# Patient Record
Sex: Male | Born: 2005 | Race: White | Hispanic: Yes | Marital: Single | Smoking: Never smoker
Health system: Southern US, Community
[De-identification: ages and names within clinical notes are randomized; demographics above are authoritative.]

## PROBLEM LIST (undated history)

## (undated) DIAGNOSIS — U071 COVID-19: Secondary | ICD-10-CM

---
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
## 2024-01-22 DIAGNOSIS — S4381XD Sprain of other specified parts of right shoulder girdle, subsequent encounter: Secondary | ICD-10-CM | POA: Diagnosis not present

## 2024-02-26 DIAGNOSIS — S4381XD Sprain of other specified parts of right shoulder girdle, subsequent encounter: Secondary | ICD-10-CM | POA: Diagnosis not present

## 2024-03-01 ENCOUNTER — Encounter (HOSPITAL_BASED_OUTPATIENT_CLINIC_OR_DEPARTMENT_OTHER): Payer: Self-pay | Admitting: Orthopaedic Surgery

## 2024-03-01 ENCOUNTER — Other Ambulatory Visit: Payer: Self-pay

## 2024-03-01 DIAGNOSIS — S4381XD Sprain of other specified parts of right shoulder girdle, subsequent encounter: Secondary | ICD-10-CM | POA: Diagnosis not present

## 2024-03-01 NOTE — Progress Notes (Signed)
   03/01/24 9077  PAT Phone Screen  Is the patient taking a GLP-1 receptor agonist? No  Do You Have Diabetes? No  Do You Have Hypertension? No  Have You Ever Been to the ER for Asthma? No  Have You Taken Oral Steroids in the Past 3 Months? No  Do you Take Phenteramine or any Other Diet Drugs? No  Recent  Lab Work, EKG, CXR? Yes  Where was this test performed? 11/04/23 [facility]  Do you have a history of heart problems? No  Any Recent Hospitalizations? No  Height 5' 10 (1.778 m)  Weight 95.3 kg  Pat Appointment Scheduled No  Reason for No Appointment Not Needed   ED visit for syncope, labs, EKG and imaging reviewed w/ Dr ErmaGLENWOOD Belton to proceed as planned w/ upcoming surgery on 6/26.

## 2024-03-01 NOTE — H&P (Signed)
 PREOPERATIVE H&P  Chief Complaint: RIGHT SHOULDER CARTILAGE DISORDER, HARDWARE COMPLICATIONS, INFECTION  HPI: Brian Lyons is a 18 y.o. male who is scheduled for, Procedure(s): ARTHROSCOPY, SHOULDER WITH DEBRIDEMENT.   Patient had a CC ligament repair on December 09, 2023. He has had issues with his wound off and on. He initially responded to antibiotics. However about 2 weeks ago his started to notice his incision was more red and swollen.   Symptoms are rated as moderate to severe, and have been worsening.  This is significantly impairing activities of daily living.    Please see clinic note for further details on this patient's care.    He has elected for surgical management.   Past Medical History:  Diagnosis Date   Acne 04/07/2020   BMI (body mass index), pediatric, greater than or equal to 95% for age 88/14/2014   COVID-19    cough and nasal drainage   Past Surgical History:  Procedure Laterality Date   ORIF FIBULA FRACTURE Left 09/24/2020   Procedure: OPEN REDUCTION INTERNAL FIXATION (ORIF) LEFT DISTAL FIBULA FRACTURE;  Surgeon: Cristy Bonner DASEN, MD;  Location: [facility] OR;  Service: Orthopedics;  Laterality: Left;   Social History   Socioeconomic History   Marital status: Single    Spouse name: Not on file   Number of children: Not on file   Years of education: Not on file   Highest education level: Not on file  Occupational History   Not on file  Tobacco Use   Smoking status: Never   Smokeless tobacco: Never  Vaping Use   Vaping status: Never Used  Substance and Sexual Activity   Alcohol use: Never   Drug use: Never   Sexual activity: Not on file  Other Topics Concern   Not on file  Social History Narrative   Not on file   Social Drivers of Health   Financial Resource Strain: Not on file  Food Insecurity: Not on file  Transportation Needs: Not on file  Physical Activity: Not on file  Stress: Not on file  Social Connections: Not on file    Family History  Problem Relation Age of Onset   Anxiety disorder Brother        following parents' deportation   ADD / ADHD Brother    Asthma Sister        mild, persistent   Diabetes Mother    Obesity Mother    Heart Problems Mother        Myxoma 2016   Hyperlipidemia Father    Alcohol abuse Father    Diabetes Maternal Grandmother    Hypertension Maternal Grandmother    Diabetes Maternal Grandfather    Hypertension Paternal Grandmother    Cancer Paternal Grandfather    No Known Allergies Prior to Admission medications   Not on File    ROS: All other systems have been reviewed and were otherwise negative with the exception of those mentioned in the HPI and as above.  Physical Exam: General: Alert, no acute distress Cardiovascular: No pedal edema Respiratory: No cyanosis, no use of accessory musculature GI: No organomegaly, abdomen is soft and non-tender Skin: No lesions in the area of chief complaint Neurologic: Sensation intact distally Psychiatric: Patient is competent for consent with normal mood and affect Lymphatic: No axillary or cervical lymphadenopathy  MUSCULOSKELETAL:  Right shoulder: incision with keloid scar present. Fluctuance. No active drainage.   Imaging: N/A  Assessment: RIGHT SHOULDER CARTILAGE DISORDER, HARDWARE COMPLICATIONS, INFECTION  Plan: Plan  for Procedure(s): ARTHROSCOPY, SHOULDER WITH DEBRIDEMENT  The risks benefits and alternatives were discussed with the patient including but not limited to the risks of nonoperative treatment, versus surgical intervention including infection, bleeding, nerve injury,  blood clots, cardiopulmonary complications, morbidity, mortality, among others, and they were willing to proceed.   The patient acknowledged the explanation, agreed to proceed with the plan and consent was signed.   Operative Plan: right shoulder scope with irrigation and debridement Discharge Medications: standard + antibiotics DVT  Prophylaxis: none Physical Therapy: continue PT Special Discharge needs: +/-   Aleck LOISE Stalling, PA-C  03/01/2024 3:30 PM

## 2024-03-01 NOTE — Discharge Instructions (Signed)
 Bonner Hair MD, MPH Aleck Stalling, PA-C [facility] [address], [address] [phone] (tel)   [phone] (fax)   POST-OPERATIVE INSTRUCTIONS - SHOULDER ARTHROSCOPY  WOUND CARE You may remove the Operative Dressing on Post-Op Day #3 (72hrs after surgery).   BUT KEEP YOUR WOUND VAC IN PLACE Alternatively if you would like you can leave dressing on until follow-up if within 7-8 days but keep it dry. Leave steri-strips in place until they fall off on their own, usually 2 weeks postop. There may be a small amount of fluid/bleeding leaking at the surgical site.  This is normal; the shoulder is filled with fluid during the procedure and can leak for 24-48hrs after surgery.  You may change/reinforce the bandage as needed.  Use the Cryocuff or Ice as often as possible for the first 7 days, then as needed for pain relief. Always keep a towel, ACE wrap or other barrier between the cooling unit and your skin.  DO NOT GET YOUR WOUND VAC WET YOU MAY BATHE BUT DO NOT GET YOUR SHOULDER WET Do not soak the shoulder in water or submerge it.  Keep incisions as dry as possible. Do not go swimming in the pool or ocean until 4 weeks after surgery or when otherwise instructed.    EXERCISES You may remove your sling once your nerve block wears off After that you do not need one  It is normal for your fingers/hand to become more swollen after surgery and discolored from bruising.   This will resolve over the first few weeks usually after surgery. Please continue to ambulate and do not stay sitting or lying for too long.  Perform foot and wrist pumps to assist in circulation.  REGIONAL ANESTHESIA (NERVE BLOCKS) The anesthesia team may have performed a nerve block for you this is a great tool used to minimize pain.   The block may start wearing off overnight (between 8-24 hours postop) When the block wears off, your pain may go from nearly zero to the pain you would have had  postop without the block. This is an abrupt transition but nothing dangerous is happening.   This can be a challenging period but utilize your as needed pain medications to try and manage this period. We suggest you use the pain medication the first night prior to going to bed, to ease this transition.  You may take an extra dose of narcotic when this happens if needed  POST-OP MEDICATIONS- Multimodal approach to pain control In general your pain will be controlled with a combination of substances.  Prescriptions unless otherwise discussed are electronically sent to your pharmacy.  This is a carefully made plan we use to minimize narcotic use.     Meloxicam - Anti-inflammatory medication taken on a scheduled basis Acetaminophen  - Non-narcotic pain medicine taken on a scheduled basis  Tramadol - This is a strong narcotic, to be used only on an "as needed" basis for SEVERE pain. Zofran  - take as needed for nausea Doxycycline and Bactrim - these are antibiotics, take as instructed  FOLLOW-UP If you develop a Fever (>=101.5), Redness or Drainage from the surgical incision site, please call our office to arrange for an evaluation. YOU WILL FOLLOW UP ON TUESDAY AT 10 AM WITH CAROLINE MCBANE, PA-C    HELPFUL INFORMATION   You may be more comfortable sleeping in a semi-seated position the first few nights following surgery.  Keep a pillow propped under the elbow and forearm for comfort.  If you have a recliner type of chair it might be beneficial.  If not that is fine too, but it would be helpful to sleep propped up with pillows behind your operated shoulder as well under your elbow and forearm.  This will reduce pulling on the suture lines.  When dressing, put your operative arm in the sleeve first.  When getting undressed, take your operative arm out last.  Loose fitting, button-down shirts are recommended.  Often in the first days after surgery you may be more comfortable keeping your operative  arm under your shirt and not through the sleeve.  You may return to work/school in the next couple of days when you feel up to it.  Desk work and typing in the sling is fine.  We suggest you use the pain medication the first night prior to going to bed, in order to ease any pain when the anesthesia wears off. You should avoid taking pain medications on an empty stomach as it will make you nauseous.  You should wean off your narcotic medicines as soon as you are able.  Most patients will be off narcotics before their first postop appointment.   Do not drink alcoholic beverages or take illicit drugs when taking pain medications.  It is against the law to drive while taking narcotics.  In some states it is against the law to drive while your arm is in a sling.   Pain medication may make you constipated.  Below are a few solutions to try in this order: Decrease the amount of pain medication if you aren't having pain. Drink lots of decaffeinated fluids. Drink prune juice and/or eat dried prunes  If the first 3 don't work start with additional solutions Take Colace - an over-the-counter stool softener Take Senokot - an over-the-counter laxative Take Miralax - a stronger over-the-counter laxative  For more information including helpful videos and documents visit our website:   [email or web address]     Post Anesthesia Home Care Instructions  Activity: Get plenty of rest for the remainder of the day. A responsible individual must stay with you for 24 hours following the procedure.  For the next 24 hours, DO NOT: -Drive a car -Advertising copywriter -Drink alcoholic beverages -Take any medication unless instructed by your physician -Make any legal decisions or sign important papers.  Meals: Start with liquid foods such as gelatin or soup. Progress to regular foods as tolerated. Avoid greasy, spicy, heavy foods. If nausea and/or vomiting occur, drink only  clear liquids until the nausea and/or vomiting subsides. Call your physician if vomiting continues.  Special Instructions/Symptoms: Your throat may feel dry or sore from the anesthesia or the breathing tube placed in your throat during surgery. If this causes discomfort, gargle with warm salt water. The discomfort should disappear within 24 hours.  If you had a scopolamine patch placed behind your ear for the management of post- operative nausea and/or vomiting:  1. The medication in the patch is effective for 72 hours, after which it should be removed.  Wrap patch in a tissue and discard in the trash. Wash hands thoroughly with soap and water. 2. You may remove the patch earlier than 72 hours if you experience unpleasant side effects which may include dry mouth, dizziness or visual disturbances. 3. Avoid touching the patch. Wash your hands with soap and water after contact with the patch.

## 2024-03-03 ENCOUNTER — Other Ambulatory Visit: Payer: Self-pay

## 2024-03-03 ENCOUNTER — Encounter (HOSPITAL_BASED_OUTPATIENT_CLINIC_OR_DEPARTMENT_OTHER)
Admission: RE | Disposition: A | Discharge status: Home / Self Care | Payer: Self-pay | Source: Home / Self Care | Attending: Orthopaedic Surgery

## 2024-03-03 ENCOUNTER — Ambulatory Visit (HOSPITAL_BASED_OUTPATIENT_CLINIC_OR_DEPARTMENT_OTHER): Admitting: Certified Registered Nurse Anesthetist

## 2024-03-03 ENCOUNTER — Ambulatory Visit (HOSPITAL_BASED_OUTPATIENT_CLINIC_OR_DEPARTMENT_OTHER)
Admission: RE | Admit: 2024-03-03 | Discharge: 2024-03-03 | Disposition: A | Discharge status: Home / Self Care | Attending: Orthopaedic Surgery | Admitting: Orthopaedic Surgery

## 2024-03-03 ENCOUNTER — Encounter (HOSPITAL_BASED_OUTPATIENT_CLINIC_OR_DEPARTMENT_OTHER): Payer: Self-pay | Admitting: Orthopaedic Surgery

## 2024-03-03 DIAGNOSIS — Y793 Surgical instruments, materials and orthopedic devices (including sutures) associated with adverse incidents: Secondary | ICD-10-CM | POA: Insufficient documentation

## 2024-03-03 DIAGNOSIS — M24111 Other articular cartilage disorders, right shoulder: Secondary | ICD-10-CM | POA: Diagnosis not present

## 2024-03-03 DIAGNOSIS — T84610A Infection and inflammatory reaction due to internal fixation device of right humerus, initial encounter: Secondary | ICD-10-CM | POA: Diagnosis not present

## 2024-03-03 DIAGNOSIS — T8579XA Infection and inflammatory reaction due to other internal prosthetic devices, implants and grafts, initial encounter: Secondary | ICD-10-CM | POA: Insufficient documentation

## 2024-03-03 DIAGNOSIS — T8484XA Pain due to internal orthopedic prosthetic devices, implants and grafts, initial encounter: Secondary | ICD-10-CM | POA: Diagnosis not present

## 2024-03-03 DIAGNOSIS — S40251A Superficial foreign body of right shoulder, initial encounter: Secondary | ICD-10-CM | POA: Diagnosis not present

## 2024-03-03 HISTORY — PX: POSTERIOR LUMBAR FUSION 2 WITH HARDWARE REMOVAL: SHX7297

## 2024-03-03 LAB — AEROBIC/ANAEROBIC CULTURE W GRAM STAIN (SURGICAL/DEEP WOUND)

## 2024-03-03 SURGERY — ARTHROSCOPY, SHOULDER WITH DEBRIDEMENT
Anesthesia: General | Laterality: Right

## 2024-03-03 MED ORDER — MIDAZOLAM HCL 5 MG/5ML IJ SOLN
INTRAMUSCULAR | Status: DC | PRN
Start: 2024-03-03 — End: 2024-03-03
  Administered 2024-03-03: 2 mg via INTRAVENOUS

## 2024-03-03 MED ORDER — MIDAZOLAM HCL 2 MG/2ML IJ SOLN
INTRAMUSCULAR | Status: AC
Start: 2024-03-03 — End: 2024-03-03
  Filled 2024-03-03: qty 2

## 2024-03-03 MED ORDER — LACTATED RINGERS IV SOLN: INTRAVENOUS | Status: DC

## 2024-03-03 MED ORDER — ONDANSETRON HCL 4 MG/2ML IJ SOLN
INTRAMUSCULAR | Status: AC
  Filled 2024-03-03: qty 2

## 2024-03-03 MED ORDER — BUPIVACAINE HCL (PF) 0.25 % IJ SOLN
INTRAMUSCULAR | Status: DC | PRN
  Administered 2024-03-03: 20 mL

## 2024-03-03 MED ORDER — FENTANYL CITRATE (PF) 100 MCG/2ML IJ SOLN: 25.0000 ug | INTRAMUSCULAR | Status: DC | PRN

## 2024-03-03 MED ORDER — TRAMADOL HCL 50 MG PO TABS: 50.0000 mg | ORAL_TABLET | Freq: Four times a day (QID) | ORAL | 0 refills | Status: AC | PRN

## 2024-03-03 MED ORDER — VANCOMYCIN HCL 1000 MG IV SOLR
INTRAVENOUS | Status: AC
  Filled 2024-03-03: qty 20

## 2024-03-03 MED ORDER — OXYCODONE HCL 5 MG/5ML PO SOLN: 5.0000 mg | Freq: Once | ORAL | Status: AC | PRN

## 2024-03-03 MED ORDER — LIDOCAINE HCL (CARDIAC) PF 100 MG/5ML IV SOSY
PREFILLED_SYRINGE | INTRAVENOUS | Status: DC | PRN
  Administered 2024-03-03: 60 mg via INTRAVENOUS

## 2024-03-03 MED ORDER — SULFAMETHOXAZOLE-TRIMETHOPRIM 800-160 MG PO TABS: 1.0000 | ORAL_TABLET | Freq: Two times a day (BID) | ORAL | 0 refills | Status: AC

## 2024-03-03 MED ORDER — MIDAZOLAM HCL 2 MG/2ML IJ SOLN
INTRAMUSCULAR | Status: AC
  Filled 2024-03-03: qty 2

## 2024-03-03 MED ORDER — OXYCODONE HCL 5 MG PO TABS
5.0000 mg | ORAL_TABLET | Freq: Once | ORAL | Status: AC | PRN
  Administered 2024-03-03: 5 mg via ORAL

## 2024-03-03 MED ORDER — ONDANSETRON HCL 4 MG PO TABS: 4.0000 mg | ORAL_TABLET | Freq: Three times a day (TID) | ORAL | 0 refills | Status: AC | PRN

## 2024-03-03 MED ORDER — PROPOFOL 10 MG/ML IV BOLUS
INTRAVENOUS | Status: AC
  Filled 2024-03-03: qty 20

## 2024-03-03 MED ORDER — AMISULPRIDE (ANTIEMETIC) 5 MG/2ML IV SOLN: 10.0000 mg | Freq: Once | INTRAVENOUS | Status: DC | PRN

## 2024-03-03 MED ORDER — OXYCODONE HCL 5 MG PO TABS
ORAL_TABLET | ORAL | Status: AC
  Filled 2024-03-03: qty 1

## 2024-03-03 MED ORDER — LIDOCAINE 2% (20 MG/ML) 5 ML SYRINGE
INTRAMUSCULAR | Status: AC
  Filled 2024-03-03: qty 5

## 2024-03-03 MED ORDER — ACETAMINOPHEN 500 MG PO TABS
ORAL_TABLET | ORAL | Status: AC
  Filled 2024-03-03: qty 2

## 2024-03-03 MED ORDER — ACETAMINOPHEN 500 MG PO TABS
1000.0000 mg | ORAL_TABLET | Freq: Once | ORAL | Status: AC
Start: 2024-03-03 — End: 2024-03-03
  Administered 2024-03-03: 1000 mg via ORAL

## 2024-03-03 MED ORDER — ONDANSETRON HCL 4 MG/2ML IJ SOLN
INTRAMUSCULAR | Status: DC | PRN
  Administered 2024-03-03: 4 mg via INTRAVENOUS

## 2024-03-03 MED ORDER — KETOROLAC TROMETHAMINE 30 MG/ML IJ SOLN: 30.0000 mg | Freq: Once | INTRAMUSCULAR | Status: DC | PRN

## 2024-03-03 MED ORDER — FENTANYL CITRATE (PF) 100 MCG/2ML IJ SOLN
INTRAMUSCULAR | Status: DC | PRN
  Administered 2024-03-03 (×2): 50 ug via INTRAVENOUS

## 2024-03-03 MED ORDER — BUPIVACAINE HCL (PF) 0.25 % IJ SOLN
INTRAMUSCULAR | Status: AC
  Filled 2024-03-03: qty 30

## 2024-03-03 MED ORDER — CEFAZOLIN SODIUM-DEXTROSE 2-4 GM/100ML-% IV SOLN
2.0000 g | INTRAVENOUS | Status: AC
  Administered 2024-03-03: 2 g via INTRAVENOUS

## 2024-03-03 MED ORDER — PROPOFOL 10 MG/ML IV BOLUS
INTRAVENOUS | Status: AC
Start: 2024-03-03 — End: 2024-03-03
  Filled 2024-03-03: qty 20

## 2024-03-03 MED ORDER — KETOROLAC TROMETHAMINE 30 MG/ML IJ SOLN
INTRAMUSCULAR | Status: DC | PRN
Start: 2024-03-03 — End: 2024-03-03
  Administered 2024-03-03: 30 mg via INTRAVENOUS

## 2024-03-03 MED ORDER — ACETAMINOPHEN 500 MG PO TABS
1000.0000 mg | ORAL_TABLET | Freq: Three times a day (TID) | ORAL | 0 refills | Status: AC
Start: 2024-03-03 — End: 2024-03-17

## 2024-03-03 MED ORDER — KETOROLAC TROMETHAMINE 30 MG/ML IJ SOLN
INTRAMUSCULAR | Status: AC
  Filled 2024-03-03: qty 1

## 2024-03-03 MED ORDER — SODIUM CHLORIDE 0.9 % IR SOLN
Status: DC | PRN
  Administered 2024-03-03: 6000 mL

## 2024-03-03 MED ORDER — ROCURONIUM BROMIDE 10 MG/ML (PF) SYRINGE
PREFILLED_SYRINGE | INTRAVENOUS | Status: AC
  Filled 2024-03-03: qty 10

## 2024-03-03 MED ORDER — MELOXICAM 7.5 MG PO TABS: 7.5000 mg | ORAL_TABLET | Freq: Two times a day (BID) | ORAL | 0 refills | Status: AC

## 2024-03-03 MED ORDER — DOXYCYCLINE HYCLATE 100 MG PO CAPS: 100.0000 mg | ORAL_CAPSULE | Freq: Two times a day (BID) | ORAL | 0 refills | Status: AC

## 2024-03-03 MED ORDER — ROCURONIUM BROMIDE 100 MG/10ML IV SOLN
INTRAVENOUS | Status: DC | PRN
  Administered 2024-03-03: 30 mg via INTRAVENOUS

## 2024-03-03 MED ORDER — PROPOFOL 10 MG/ML IV BOLUS
INTRAVENOUS | Status: DC | PRN
  Administered 2024-03-03: 200 mg via INTRAVENOUS

## 2024-03-03 MED ORDER — DEXAMETHASONE SODIUM PHOSPHATE 10 MG/ML IJ SOLN
INTRAMUSCULAR | Status: AC
  Filled 2024-03-03: qty 1

## 2024-03-03 MED ORDER — DEXAMETHASONE SODIUM PHOSPHATE 4 MG/ML IJ SOLN
INTRAMUSCULAR | Status: DC | PRN
  Administered 2024-03-03: 5 mg via INTRAVENOUS

## 2024-03-03 MED ORDER — FENTANYL CITRATE (PF) 100 MCG/2ML IJ SOLN
INTRAMUSCULAR | Status: AC
  Filled 2024-03-03: qty 2

## 2024-03-03 MED ORDER — SUGAMMADEX SODIUM 200 MG/2ML IV SOLN
INTRAVENOUS | Status: DC | PRN
Start: 2024-03-03 — End: 2024-03-03
  Administered 2024-03-03: 200 mg via INTRAVENOUS

## 2024-03-03 MED ORDER — CEFAZOLIN SODIUM-DEXTROSE 2-4 GM/100ML-% IV SOLN
INTRAVENOUS | Status: AC
  Filled 2024-03-03: qty 100

## 2024-03-03 SURGICAL SUPPLY — 49 items
BLADE EXCALIBUR 4.0X13 (MISCELLANEOUS) ×2 IMPLANT
BURR OVAL 8 FLU 4.0X13 (MISCELLANEOUS) ×2 IMPLANT
CANNULA 5.75X71 LONG (CANNULA) IMPLANT
CANNULA PASSPORT 5 (CANNULA) IMPLANT
CANNULA PASSPORT BUTTON 10-40 (CANNULA) IMPLANT
CANNULA TWIST IN 8.25X7CM (CANNULA) IMPLANT
CHLORAPREP W/TINT 26 (MISCELLANEOUS) ×2 IMPLANT
CLSR STERI-STRIP ANTIMIC 1/2X4 (GAUZE/BANDAGES/DRESSINGS) ×2 IMPLANT
COOLER ICEMAN CLASSIC (MISCELLANEOUS) ×2 IMPLANT
DRAPE IMP U-DRAPE 54X76 (DRAPES) ×2 IMPLANT
DRAPE INCISE IOBAN 66X45 STRL (DRAPES) IMPLANT
DRAPE SHOULDER BEACH CHAIR (DRAPES) ×2 IMPLANT
DRESSING PEEL AND PLC PRVNA 13 (GAUZE/BANDAGES/DRESSINGS) IMPLANT
DW OUTFLOW CASSETTE/TUBE SET (MISCELLANEOUS) ×2 IMPLANT
GAUZE PAD ABD 8X10 STRL (GAUZE/BANDAGES/DRESSINGS) ×2 IMPLANT
GAUZE SPONGE 4X4 12PLY STRL (GAUZE/BANDAGES/DRESSINGS) ×2 IMPLANT
GLOVE BIO SURGEON STRL SZ 6.5 (GLOVE) ×2 IMPLANT
GLOVE BIOGEL PI IND STRL 6.5 (GLOVE) ×2 IMPLANT
GLOVE BIOGEL PI IND STRL 8 (GLOVE) ×2 IMPLANT
GLOVE ECLIPSE 8.0 STRL XLNG CF (GLOVE) ×2 IMPLANT
GOWN STRL REUS W/ TWL LRG LVL3 (GOWN DISPOSABLE) ×4 IMPLANT
GOWN STRL REUS W/TWL XL LVL3 (GOWN DISPOSABLE) ×2 IMPLANT
KIT DRSG PREVENA PLUS 7DAY 125 (MISCELLANEOUS) IMPLANT
KIT STABILIZATION SHOULDER (MISCELLANEOUS) ×2 IMPLANT
KIT STR SPEAR 1.8 FBRTK DISP (KITS) IMPLANT
LASSO 90 CVE QUICKPAS (DISPOSABLE) IMPLANT
LASSO CRESCENT QUICKPASS (SUTURE) IMPLANT
MANIFOLD NEPTUNE II (INSTRUMENTS) ×2 IMPLANT
NDL HD SCORPION MEGA LOADER (NEEDLE) IMPLANT
NDL SAFETY ECLIPSE 18X1.5 (NEEDLE) ×2 IMPLANT
PACK ARTHROSCOPY DSU (CUSTOM PROCEDURE TRAY) ×2 IMPLANT
PACK BASIN DAY SURGERY FS (CUSTOM PROCEDURE TRAY) ×2 IMPLANT
PAD COLD SHLDR WRAP-ON (PAD) ×2 IMPLANT
PAD ORTHO SHOULDER 7X19 LRG (SOFTGOODS) ×2 IMPLANT
RESTRAINT HEAD UNIVERSAL NS (MISCELLANEOUS) ×2 IMPLANT
SHEET MEDIUM DRAPE 40X70 STRL (DRAPES) ×2 IMPLANT
SLEEVE SCD COMPRESS KNEE MED (STOCKING) ×2 IMPLANT
SUT ETHILON 3 0 PS 1 (SUTURE) IMPLANT
SUT MNCRL AB 4-0 PS2 18 (SUTURE) ×2 IMPLANT
SUT PDS AB 0 CT 36 (SUTURE) IMPLANT
SUT TIGER TAPE 7 IN WHITE (SUTURE) IMPLANT
SUTURE FIBERWR #2 38 T-5 BLUE (SUTURE) IMPLANT
SUTURE TAPE TIGERLINK 1.3MM BL (SUTURE) IMPLANT
SYR 5ML LL (SYRINGE) ×2 IMPLANT
TAPE FIBER 2MM 7IN #2 BLUE (SUTURE) IMPLANT
TOWEL GREEN STERILE FF (TOWEL DISPOSABLE) ×4 IMPLANT
TUBE CONNECTING 20X1/4 (TUBING) ×2 IMPLANT
TUBING ARTHROSCOPY IRRIG 16FT (MISCELLANEOUS) ×2 IMPLANT
WAND ABLATOR APOLLO I90 (BUR) ×2 IMPLANT

## 2024-03-03 NOTE — Transfer of Care (Signed)
 Immediate Anesthesia Transfer of Care Note  Patient: Brian Lyons  Procedure(s) Performed: ARTHROSCOPY, SHOULDER WITH DEBRIDEMENT (Right)  Patient Location: PACU  Anesthesia Type:General  Level of Consciousness: awake, alert , and oriented  Airway & Oxygen Therapy: Patient Spontanous Breathing and Patient connected to face mask oxygen  Post-op Assessment: Report given to RN and Post -op Vital signs reviewed and stable  Post vital signs: Reviewed and stable  Last Vitals:  Vitals Value Taken Time  BP 112/78 03/03/24 11:09  Temp    Pulse 72 03/03/24 11:10  Resp 11 03/03/24 11:10  SpO2 95 % 03/03/24 11:10  Vitals shown include unfiled device data.  Last Pain:  Vitals:   03/03/24 0912  TempSrc: Temporal  PainSc: 0-No pain      Patients Stated Pain Goal: 5 (03/03/24 0912)  Complications: No notable events documented.

## 2024-03-03 NOTE — Op Note (Signed)
 Orthopaedic Surgery Operative Note (CSN: [id number])  Brian Lyons  31-Dec-2005 Date of Surgery: 03/03/2024   Diagnoses:  Right shoulder postoperative superficial infection  Procedure: Right shoulder arthroscopy with extensive debridement and synovectomy Right shoulder incision debridement of superficial wound Right shoulder removal hardware   Operative Finding Successful completion of the planned procedure.  Patient's joint was essentially pristine with exception of some mild redness in the anterior interval that would be consistent with his surgery 2 months ago.  We irrigated the joint with 3 L of normal saline.  There is no sign of contamination.  We then closed his incisions in the isolated the incisions from the superior area of concern.  The previous open incision over the clavicle was then opened and there is gross purulence.  We debrided this back and removed his sutures as they seem to be tracking to the purulent area.  Both fiber tapes were removed.  Will place the patient on doxycycline and Bactrim to cover for P acnes as well as the possibility of MRSA as patient is positive.  He will use a sling for comfort only  Post-operative plan: The patient will be discharged home.  The patient will be weightbearing to tolerance.  DVT prophylaxis not indicated in this ambulatory upper extremity patient without significant risk factors.   Pain control with PRN pain medication preferring oral medicines.  Follow up plan will be scheduled in approximately 7 days for incision check.  Post-Op Diagnosis: Same Surgeons:Primary: Cristy Bonner DASEN, MD Assistants:Caroline McBane, PA-C Location: [facility] OR ROOM 6 Anesthesia: General with local anesthesia Antibiotics: Ancef  2 g with local vancomycin  powder 1 g at the surgical site Tourniquet time:  Estimated Blood Loss: Minimal Complications: None Specimens: 2 for culture, right shoulder 1 and 2 Implants: * No implants in log *  Indications  for Surgery:   Brian Lyons is a 18 y.o. 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  Unfortunately patient developed the superior incision abscess.  We have tried to treat it nonoperatively however it continued to progress.  There were no signs of septic joint.  Benefits and risks of operative and nonoperative management were discussed prior to surgery with patient/guardian(s) and informed consent form was completed.  Specific risks including infection, need for additional surgery, continued pain, recurrent infection amongst others.   Procedure:   The patient was identified properly. Informed consent was obtained and the surgical site was marked. The patient was taken up to suite where general anesthesia was induced.  The patient was positioned patient on Allen table.  The right shoulder was prepped and draped in the usual sterile fashion.  Timeout was performed before the beginning of the case.  We began with standard anterior posterior arthroscopy portals.  Upon exception of remove the rotator interval, labrum, capsule, synovitis amongst others.  We then ran 3 L normal saline through the joint irrigating constantly.  We then closed these incisions with nonabsorbable suture.  We placed a [place] over them to isolate them.  We turned attention to the open portion of the case.  We made a small 3 cm incision over the area of fluctuance in the superior shoulder.  Obvious purulent material is noted.  We took multiple samples.  We then excisionally debrided skin, fascia, muscle, bone with a rondure and scalpel.  We irrigated with 3 L normal saline.  We identified the fiber tape sutures in the superior aspect of these were communicating with purulent material.  I felt it was appropriate to remove these hardware.  We removed both of them entirety.  We irrigated again and closed incision with non absorbable suture after placement with vancomycin  powder.  Patient was  awoken and taken to PACU in stable condition.  Aleck Stalling, PA-C, present and scrubbed throughout the case, critical for completion in a timely fashion, and for retraction, instrumentation, closure.

## 2024-03-03 NOTE — Anesthesia Preprocedure Evaluation (Addendum)
 Anesthesia Evaluation  Patient identified by MRN, date of birth, ID band Patient awake    Reviewed: Allergy & Precautions, NPO status , Patient's Chart, lab work & pertinent test results  Airway Mallampati: II  TM Distance: >3 FB Neck ROM: Full    Dental no notable dental hx.    Pulmonary neg pulmonary ROS   Pulmonary exam normal        Cardiovascular negative cardio ROS Normal cardiovascular exam     Neuro/Psych negative neurological ROS  negative psych ROS   GI/Hepatic negative GI ROS, Neg liver ROS,,,  Endo/Other  negative endocrine ROS    Renal/GU negative Renal ROS     Musculoskeletal   Abdominal   Peds  Hematology negative hematology ROS (+)   Anesthesia Other Findings RIGHT SHOULDER CARTILAGE DISORDER, HARDWARE COMPLICATIONS, INFECTION  Reproductive/Obstetrics                             Anesthesia Physical Anesthesia Plan  ASA: 1  Anesthesia Plan: General   Post-op Pain Management:    Induction: Intravenous  PONV Risk Score and Plan: 2 and Ondansetron , Dexamethasone , Midazolam  and Treatment may vary due to age or medical condition  Airway Management Planned: Oral ETT  Additional Equipment:   Intra-op Plan:   Post-operative Plan: Extubation in OR  Informed Consent: I have reviewed the patients History and Physical, chart, labs and discussed the procedure including the risks, benefits and alternatives for the proposed anesthesia with the patient or authorized representative who has indicated his/her understanding and acceptance.     Dental advisory given  Plan Discussed with: CRNA and Surgeon  Anesthesia Plan Comments: (Block deferred per surgeon request )       Anesthesia Quick Evaluation

## 2024-03-03 NOTE — Interval H&P Note (Signed)
 All questions answered, patient wants to proceed with procedure. ? ?

## 2024-03-03 NOTE — Anesthesia Procedure Notes (Signed)
 Procedure Name: Intubation Date/Time: 03/03/2024 10:02 AM  Performed by: Buster Catheryn SAUNDERS, CRNAPre-anesthesia Checklist: Patient identified, Emergency Drugs available, Suction available and Patient being monitored Patient Re-evaluated:Patient Re-evaluated prior to induction Oxygen Delivery Method: Circle system utilized Preoxygenation: Pre-oxygenation with 100% oxygen Induction Type: IV induction Ventilation: Mask ventilation without difficulty Laryngoscope Size: Miller and 2 Grade View: Grade II Tube type: Oral Tube size: 7.0 mm Number of attempts: 1 Airway Equipment and Method: Stylet and Oral airway Placement Confirmation: ETT inserted through vocal cords under direct vision, positive ETCO2 and breath sounds checked- equal and bilateral Secured at: 24 cm Tube secured with: Tape Dental Injury: Teeth and Oropharynx as per pre-operative assessment

## 2024-03-04 ENCOUNTER — Encounter (HOSPITAL_BASED_OUTPATIENT_CLINIC_OR_DEPARTMENT_OTHER): Payer: Self-pay | Admitting: Orthopaedic Surgery

## 2024-03-04 NOTE — Anesthesia Postprocedure Evaluation (Signed)
 Anesthesia Post Note  Patient: Brian Lyons  Procedure(s) Performed: ARTHROSCOPY, SHOULDER WITH DEBRIDEMENT (Right)     Patient location during evaluation: PACU Anesthesia Type: General Level of consciousness: awake Pain management: pain level controlled Vital Signs Assessment: post-procedure vital signs reviewed and stable Respiratory status: spontaneous breathing, nonlabored ventilation and respiratory function stable Cardiovascular status: blood pressure returned to baseline and stable Postop Assessment: no apparent nausea or vomiting Anesthetic complications: no   No notable events documented.  Last Vitals:  Vitals:   03/03/24 1145 03/03/24 1204  BP: 111/67 115/78  Pulse: (!) 48 (!) 57  Resp: 12 16  Temp:  (!) 36.3 C  SpO2: 96% 99%    Last Pain:  Vitals:   03/03/24 1204  TempSrc: Temporal  PainSc: 5                  Elmina Hendel P Daelen Belvedere

## 2024-03-07 ENCOUNTER — Encounter (HOSPITAL_BASED_OUTPATIENT_CLINIC_OR_DEPARTMENT_OTHER): Payer: Self-pay | Admitting: Orthopaedic Surgery

## 2024-03-08 ENCOUNTER — Telehealth: Payer: Self-pay

## 2024-03-08 NOTE — Telephone Encounter (Signed)
 [facility] called and wanted MD to be aware of results from culture that was done on 6/26. (Results in chart)

## 2024-03-08 NOTE — Telephone Encounter (Signed)
 Attempted to call correct MD (surgeon) with the results, unable to speak with anyone directly. I called [facility] microbiology back to inform. Thank you

## 2024-03-08 NOTE — Telephone Encounter (Signed)
 Called Microbiology  back to inform that the results should go to the surgeon per MD in office as we have not seen patient recently.

## 2024-03-08 NOTE — Telephone Encounter (Signed)
 Microbiology result taken intraoperatively Dr Deryl was the surgeon. At [facility]. Please try to reach that office and notice the clinical staff of surgeon if available.

## 2024-03-09 LAB — AEROBIC/ANAEROBIC CULTURE W GRAM STAIN (SURGICAL/DEEP WOUND): Culture: NO GROWTH

## 2024-03-11 LAB — AEROBIC/ANAEROBIC CULTURE W GRAM STAIN (SURGICAL/DEEP WOUND)

## 2024-03-15 DIAGNOSIS — M24111 Other articular cartilage disorders, right shoulder: Secondary | ICD-10-CM | POA: Diagnosis not present

## 2024-09-13 ENCOUNTER — Encounter (HOSPITAL_BASED_OUTPATIENT_CLINIC_OR_DEPARTMENT_OTHER): Payer: Self-pay | Admitting: Orthopaedic Surgery
